# Patient Record
Sex: Male | Born: 1945 | Race: Black or African American | Hispanic: No | Marital: Married | Smoking: Former smoker
Health system: Southern US, Community
[De-identification: ages and names within clinical notes are randomized; demographics above are authoritative.]

## PROBLEM LIST (undated history)

## (undated) DIAGNOSIS — I10 Essential (primary) hypertension: Secondary | ICD-10-CM

## (undated) DIAGNOSIS — C679 Malignant neoplasm of bladder, unspecified: Secondary | ICD-10-CM

## (undated) DIAGNOSIS — K625 Hemorrhage of anus and rectum: Secondary | ICD-10-CM

## (undated) DIAGNOSIS — D696 Thrombocytopenia, unspecified: Secondary | ICD-10-CM

## (undated) HISTORY — DX: Malignant neoplasm of bladder, unspecified: C67.9

## (undated) HISTORY — DX: Thrombocytopenia, unspecified: D69.6

## (undated) HISTORY — DX: Essential (primary) hypertension: I10

## (undated) HISTORY — PX: BLADDER SURGERY: SHX569

## (undated) HISTORY — PX: CYST EXCISION: SHX5701

## (undated) HISTORY — PX: UPPER GI ENDOSCOPY: SHX6162

## (undated) HISTORY — PX: COLONOSCOPY: SHX174

---

## 2013-04-22 ENCOUNTER — Ambulatory Visit (INDEPENDENT_AMBULATORY_CARE_PROVIDER_SITE_OTHER): Payer: Self-pay | Admitting: Podiatrist

## 2013-04-22 ENCOUNTER — Encounter: Payer: Self-pay | Admitting: Podiatrist

## 2013-04-22 VITALS — BP 151/80 | HR 73 | Resp 18 | Ht 72.0 in | Wt 275.0 lb

## 2013-04-22 DIAGNOSIS — M722 Plantar fascial fibromatosis: Secondary | ICD-10-CM

## 2013-04-22 DIAGNOSIS — B351 Tinea unguium: Secondary | ICD-10-CM

## 2013-04-22 NOTE — Progress Notes (Signed)
Subjective: Patient presents today for continued care of plantar fasciitis left. Patient states the right foot has continued to improve. She relates injection was maximally beneficial on the right foot however the left continues to be uncomfortable. Patient states he's been stretching and icing as instructed and he got a new pair of new balance shoes. Objective: Patient's continues to have pain on the plantar medial aspect of the left heel at the insertion of the plantar fascia on the medial calcaneall tubercle. Assessment: Plantar fasciitis left Plan: Discussed etiology, pathology, conservative vs. Surgical therapies and at this time a plantar fascial injection was recommended.  The patient agreed and a sterile skin prep was applied.  An injection consisting of kenalog and marcaine mixture was infiltrated at the point of maximal tenderness on the left Heel.  The patient tolerated this well and was given instructions for aftercare.  Patient was also dispensed powerstep inserts and given instructions for use.  Will reappoint at prn intervals

## 2013-04-22 NOTE — Patient Instructions (Signed)
Continue to ice, and stretch your foot.  Continue to wear your good supportive shoes.  Call if symptoms persist or worsen after 3 weeks Plantar Fasciitis (Heel Spur Syndrome) with Rehab The plantar fascia is a fibrous, ligament-like, soft-tissue structure that spans the bottom of the foot. Plantar fasciitis is a condition that causes pain in the foot due to inflammation of the tissue. SYMPTOMS   Pain and tenderness on the underneath side of the foot.  Pain that worsens with standing or walking. CAUSES  Plantar fasciitis is caused by irritation and injury to the plantar fascia on the underneath side of the foot. Common mechanisms of injury include:  Direct trauma to bottom of the foot.  Damage to a small nerve that runs under the foot where the main fascia attaches to the heel bone.  Stress placed on the plantar fascia due to bone spurs. RISK INCREASES WITH:   Activities that place stress on the plantar fascia (running, jumping, pivoting, or cutting).  Poor strength and flexibility.  Improperly fitted shoes.  Tight calf muscles.  Flat feet.  Failure to warm-up properly before activity.  Obesity. PREVENTION  Warm up and stretch properly before activity.  Allow for adequate recovery between workouts.  Maintain physical fitness:  Strength, flexibility, and endurance.  Cardiovascular fitness.  Maintain a health body weight.  Avoid stress on the plantar fascia.  Wear properly fitted shoes, including arch supports for individuals who have flat feet. PROGNOSIS  If treated properly, then the symptoms of plantar fasciitis usually resolve without surgery. However, occasionally surgery is necessary. RELATED COMPLICATIONS   Recurrent symptoms that may result in a chronic condition.  Problems of the lower back that are caused by compensating for the injury, such as limping.  Pain or weakness of the foot during push-off following surgery.  Chronic inflammation, scarring,  and partial or complete fascia tear, occurring more often from repeated injections. TREATMENT  Treatment initially involves the use of ice and medication to help reduce pain and inflammation. The use of strengthening and stretching exercises may help reduce pain with activity, especially stretches of the Achilles tendon. These exercises may be performed at home or with a therapist. Your caregiver may recommend that you use heel cups of arch supports to help reduce stress on the plantar fascia. Occasionally, corticosteroid injections are given to reduce inflammation. If symptoms persist for greater than 6 months despite non-surgical (conservative), then surgery may be recommended.  MEDICATION   If pain medication is necessary, then nonsteroidal anti-inflammatory medications, such as aspirin and ibuprofen, or other minor pain relievers, such as acetaminophen, are often recommended.  Do not take pain medication within 7 days before surgery.  Prescription pain relievers may be given if deemed necessary by your caregiver. Use only as directed and only as much as you need.  Corticosteroid injections may be given by your caregiver. These injections should be reserved for the most serious cases, because they may only be given a certain number of times. HEAT AND COLD  Cold treatment (icing) relieves pain and reduces inflammation. Cold treatment should be applied for 10 to 15 minutes every 2 to 3 hours for inflammation and pain and immediately after any activity that aggravates your symptoms. Use ice packs or massage the area with a piece of ice (ice massage).  Heat treatment may be used prior to performing the stretching and strengthening activities prescribed by your caregiver, physical therapist, or athletic trainer. Use a heat pack or soak the injury in warm water.  SEEK IMMEDIATE MEDICAL CARE IF:  Treatment seems to offer no benefit, or the condition worsens.  Any medications produce adverse side  effects. EXERCISES RANGE OF MOTION (ROM) AND STRETCHING EXERCISES - Plantar Fasciitis (Heel Spur Syndrome) These exercises may help you when beginning to rehabilitate your injury. Your symptoms may resolve with or without further involvement from your physician, physical therapist or athletic trainer. While completing these exercises, remember:   Restoring tissue flexibility helps normal motion to return to the joints. This allows healthier, less painful movement and activity.  An effective stretch should be held for at least 30 seconds.  A stretch should never be painful. You should only feel a gentle lengthening or release in the stretched tissue. RANGE OF MOTION - Toe Extension, Flexion  Sit with your right / left leg crossed over your opposite knee.  Grasp your toes and gently pull them back toward the top of your foot. You should feel a stretch on the bottom of your toes and/or foot.  Hold this stretch for __________ seconds.  Now, gently pull your toes toward the bottom of your foot. You should feel a stretch on the top of your toes and or foot.  Hold this stretch for __________ seconds. Repeat __________ times. Complete this stretch __________ times per day.  RANGE OF MOTION - Ankle Dorsiflexion, Active Assisted  Remove shoes and sit on a chair that is preferably not on a carpeted surface.  Place right / left foot under knee. Extend your opposite leg for support.  Keeping your heel down, slide your right / left foot back toward the chair until you feel a stretch at your ankle or calf. If you do not feel a stretch, slide your bottom forward to the edge of the chair, while still keeping your heel down.  Hold this stretch for __________ seconds. Repeat __________ times. Complete this stretch __________ times per day.  STRETCH  Gastroc, Standing  Place hands on wall.  Extend right / left leg, keeping the front knee somewhat bent.  Slightly point your toes inward on your back  foot.  Keeping your right / left heel on the floor and your knee straight, shift your weight toward the wall, not allowing your back to arch.  You should feel a gentle stretch in the right / left calf. Hold this position for __________ seconds. Repeat __________ times. Complete this stretch __________ times per day. STRETCH  Soleus, Standing  Place hands on wall.  Extend right / left leg, keeping the other knee somewhat bent.  Slightly point your toes inward on your back foot.  Keep your right / left heel on the floor, bend your back knee, and slightly shift your weight over the back leg so that you feel a gentle stretch deep in your back calf.  Hold this position for __________ seconds. Repeat __________ times. Complete this stretch __________ times per day. STRETCH  Gastrocsoleus, Standing  Note: This exercise can place a lot of stress on your foot and ankle. Please complete this exercise only if specifically instructed by your caregiver.   Place the ball of your right / left foot on a step, keeping your other foot firmly on the same step.  Hold on to the wall or a rail for balance.  Slowly lift your other foot, allowing your body weight to press your heel down over the edge of the step.  You should feel a stretch in your right / left calf.  Hold this position for __________  seconds.  Repeat this exercise with a slight bend in your right / left knee. Repeat __________ times. Complete this stretch __________ times per day.  STRENGTHENING EXERCISES - Plantar Fasciitis (Heel Spur Syndrome)  These exercises may help you when beginning to rehabilitate your injury. They may resolve your symptoms with or without further involvement from your physician, physical therapist or athletic trainer. While completing these exercises, remember:   Muscles can gain both the endurance and the strength needed for everyday activities through controlled exercises.  Complete these exercises as  instructed by your physician, physical therapist or athletic trainer. Progress the resistance and repetitions only as guided. STRENGTH - Towel Curls  Sit in a chair positioned on a non-carpeted surface.  Place your foot on a towel, keeping your heel on the floor.  Pull the towel toward your heel by only curling your toes. Keep your heel on the floor.  If instructed by your physician, physical therapist or athletic trainer, add ____________________ at the end of the towel. Repeat __________ times. Complete this exercise __________ times per day. STRENGTH - Ankle Inversion  Secure one end of a rubber exercise band/tubing to a fixed object (table, pole). Loop the other end around your foot just before your toes.  Place your fists between your knees. This will focus your strengthening at your ankle.  Slowly, pull your big toe up and in, making sure the band/tubing is positioned to resist the entire motion.  Hold this position for __________ seconds.  Have your muscles resist the band/tubing as it slowly pulls your foot back to the starting position. Repeat __________ times. Complete this exercises __________ times per day.  Document Released: 07/08/2005 Document Revised: 09/30/2011 Document Reviewed: 10/20/2008 [facility] Patient Information 2014 [place], [place].

## 2013-04-23 MED ORDER — TRIAMCINOLONE ACETONIDE 10 MG/ML IJ SUSP
10.0000 mg | Freq: Once | INTRAMUSCULAR | Status: AC
  Administered 2013-04-22: 10 mg

## 2013-04-23 NOTE — Addendum Note (Signed)
Addended by: Delories Heinz on: 04/23/2013 08:56 AM   Modules accepted: Orders

## 2013-06-10 ENCOUNTER — Ambulatory Visit: Payer: Self-pay | Admitting: Emergency Medicine

## 2013-06-10 LAB — CBC WITH DIFFERENTIAL/PLATELET
Basophil #: 0 10*3/uL (ref 0.0–0.1)
Basophil %: 0.5 %
Eosinophil #: 0.1 10*3/uL (ref 0.0–0.7)
Eosinophil %: 1.4 %
HCT: 30.4 % — ABNORMAL LOW (ref 40.0–52.0)
HGB: 9.4 g/dL — ABNORMAL LOW (ref 13.0–18.0)
Lymphocyte #: 2.6 10*3/uL (ref 1.0–3.6)
Lymphocyte %: 29.5 %
MCH: 27 pg (ref 26.0–34.0)
MCHC: 30.8 g/dL — ABNORMAL LOW (ref 32.0–36.0)
MCV: 88 fL (ref 80–100)
Monocyte #: 0.9 x10 3/mm (ref 0.2–1.0)
Monocyte %: 9.8 %
Neutrophil #: 5.1 10*3/uL (ref 1.4–6.5)
Neutrophil %: 58.8 %
Platelet: 795 10*3/uL — ABNORMAL HIGH (ref 150–440)
RBC: 3.47 10*6/uL — ABNORMAL LOW (ref 4.40–5.90)
RDW: 15.3 % — ABNORMAL HIGH (ref 11.5–14.5)
WBC: 8.8 10*3/uL (ref 3.8–10.6)

## 2013-06-10 LAB — COMPREHENSIVE METABOLIC PANEL
Albumin: 4.1 g/dL (ref 3.4–5.0)
Alkaline Phosphatase: 83 U/L (ref 50–136)
Anion Gap: 8 (ref 7–16)
BUN: 13 mg/dL (ref 7–18)
Bilirubin,Total: 0.4 mg/dL (ref 0.2–1.0)
Calcium, Total: 10.2 mg/dL — ABNORMAL HIGH (ref 8.5–10.1)
Chloride: 104 mmol/L (ref 98–107)
Co2: 30 mmol/L (ref 21–32)
Creatinine: 1.2 mg/dL (ref 0.60–1.30)
EGFR (African American): >60
EGFR (Non-African Amer.): >60
Glucose: 88 mg/dL (ref 65–99)
Osmolality: 283 (ref 275–301)
Potassium: 4.6 mmol/L (ref 3.5–5.1)
SGOT(AST): 26 U/L (ref 15–37)
SGPT (ALT): 48 U/L (ref 12–78)
Sodium: 142 mmol/L (ref 136–145)
Total Protein: 8.1 g/dL (ref 6.4–8.2)

## 2013-06-10 LAB — OCCULT BLOOD X 1 CARD TO LAB, STOOL: Occult Blood, Feces: POSITIVE

## 2013-06-10 LAB — URINALYSIS, COMPLETE
Bacteria: NEGATIVE
Bilirubin,UR: NEGATIVE
Blood: NEGATIVE
Glucose,UR: NEGATIVE mg/dL (ref 0–75)
Leukocyte Esterase: NEGATIVE
Specific Gravity: 1.015 (ref 1.003–1.030)

## 2013-10-26 DIAGNOSIS — D509 Iron deficiency anemia, unspecified: Secondary | ICD-10-CM | POA: Diagnosis not present

## 2013-11-11 ENCOUNTER — Ambulatory Visit: Payer: Self-pay | Admitting: Gastroenterology

## 2013-11-11 DIAGNOSIS — E669 Obesity, unspecified: Secondary | ICD-10-CM | POA: Diagnosis not present

## 2013-11-11 DIAGNOSIS — D126 Benign neoplasm of colon, unspecified: Secondary | ICD-10-CM | POA: Diagnosis not present

## 2013-11-11 DIAGNOSIS — M1710 Unilateral primary osteoarthritis, unspecified knee: Secondary | ICD-10-CM | POA: Diagnosis not present

## 2013-11-11 DIAGNOSIS — K648 Other hemorrhoids: Secondary | ICD-10-CM | POA: Diagnosis not present

## 2013-11-11 DIAGNOSIS — D509 Iron deficiency anemia, unspecified: Secondary | ICD-10-CM | POA: Diagnosis not present

## 2013-11-11 DIAGNOSIS — K269 Duodenal ulcer, unspecified as acute or chronic, without hemorrhage or perforation: Secondary | ICD-10-CM | POA: Diagnosis not present

## 2013-11-11 DIAGNOSIS — K2970 Gastritis, unspecified, without bleeding: Secondary | ICD-10-CM | POA: Diagnosis not present

## 2013-11-11 DIAGNOSIS — K2990 Gastroduodenitis, unspecified, without bleeding: Secondary | ICD-10-CM | POA: Diagnosis not present

## 2013-11-11 DIAGNOSIS — Z6838 Body mass index (BMI) 38.0-38.9, adult: Secondary | ICD-10-CM | POA: Diagnosis not present

## 2013-11-11 DIAGNOSIS — Z833 Family history of diabetes mellitus: Secondary | ICD-10-CM | POA: Diagnosis not present

## 2013-11-11 DIAGNOSIS — Z86010 Personal history of colonic polyps: Secondary | ICD-10-CM | POA: Diagnosis not present

## 2013-11-11 DIAGNOSIS — Z800 Family history of malignant neoplasm of digestive organs: Secondary | ICD-10-CM | POA: Diagnosis not present

## 2013-11-11 DIAGNOSIS — Z87891 Personal history of nicotine dependence: Secondary | ICD-10-CM | POA: Diagnosis not present

## 2013-12-01 DIAGNOSIS — D649 Anemia, unspecified: Secondary | ICD-10-CM | POA: Diagnosis not present

## 2013-12-10 ENCOUNTER — Ambulatory Visit: Payer: Self-pay | Admitting: Gastroenterology

## 2013-12-10 DIAGNOSIS — D509 Iron deficiency anemia, unspecified: Secondary | ICD-10-CM | POA: Diagnosis not present

## 2013-12-10 DIAGNOSIS — K5520 Angiodysplasia of colon without hemorrhage: Secondary | ICD-10-CM | POA: Diagnosis not present

## 2013-12-10 DIAGNOSIS — K922 Gastrointestinal hemorrhage, unspecified: Secondary | ICD-10-CM | POA: Diagnosis not present

## 2013-12-10 DIAGNOSIS — R195 Other fecal abnormalities: Secondary | ICD-10-CM | POA: Diagnosis not present

## 2013-12-14 DIAGNOSIS — M5430 Sciatica, unspecified side: Secondary | ICD-10-CM | POA: Diagnosis not present

## 2015-01-12 ENCOUNTER — Encounter: Payer: Self-pay | Admitting: *Deleted

## 2015-01-12 ENCOUNTER — Emergency Department: Payer: Medicare Other

## 2015-01-12 ENCOUNTER — Emergency Department
Admission: EM | Admit: 2015-01-12 | Discharge: 2015-01-12 | Disposition: A | Discharge status: Home / Self Care | Payer: Medicare Other | Attending: Emergency Medicine | Admitting: Emergency Medicine

## 2015-01-12 DIAGNOSIS — Z79899 Other long term (current) drug therapy: Secondary | ICD-10-CM | POA: Insufficient documentation

## 2015-01-12 DIAGNOSIS — H538 Other visual disturbances: Secondary | ICD-10-CM | POA: Diagnosis not present

## 2015-01-12 DIAGNOSIS — D473 Essential (hemorrhagic) thrombocythemia: Secondary | ICD-10-CM | POA: Insufficient documentation

## 2015-01-12 DIAGNOSIS — R51 Headache: Secondary | ICD-10-CM | POA: Diagnosis not present

## 2015-01-12 DIAGNOSIS — Z87891 Personal history of nicotine dependence: Secondary | ICD-10-CM | POA: Diagnosis not present

## 2015-01-12 DIAGNOSIS — D75839 Thrombocytosis, unspecified: Secondary | ICD-10-CM

## 2015-01-12 DIAGNOSIS — H532 Diplopia: Secondary | ICD-10-CM | POA: Diagnosis present

## 2015-01-12 HISTORY — DX: Hemorrhage of anus and rectum: K62.5

## 2015-01-12 LAB — BASIC METABOLIC PANEL
Anion gap: 5 (ref 5–15)
BUN: 12 mg/dL (ref 6–20)
CALCIUM: 10 mg/dL (ref 8.9–10.3)
CO2: 30 mmol/L (ref 22–32)
CREATININE: 1.18 mg/dL (ref 0.61–1.24)
Chloride: 104 mmol/L (ref 101–111)
GFR calc non Af Amer: >60 mL/min (ref >60)
Glucose, Bld: 116 mg/dL — ABNORMAL HIGH (ref 65–99)
POTASSIUM: 4 mmol/L (ref 3.5–5.1)
SODIUM: 139 mmol/L (ref 135–145)

## 2015-01-12 LAB — CBC
HCT: 35.5 % — ABNORMAL LOW (ref 40.0–52.0)
Hemoglobin: 11.3 g/dL — ABNORMAL LOW (ref 13.0–18.0)
MCH: 30.9 pg (ref 26.0–34.0)
MCHC: 32 g/dL (ref 32.0–36.0)
MCV: 96.6 fL (ref 80.0–100.0)
PLATELETS: 1317 10*3/uL — AB (ref 150–440)
RBC: 3.67 MIL/uL — ABNORMAL LOW (ref 4.40–5.90)
RDW: 13.8 % (ref 11.5–14.5)
WBC: 8.6 10*3/uL (ref 3.8–10.6)

## 2015-01-12 LAB — DIFFERENTIAL
Basophils Absolute: 0.1 10*3/uL (ref 0–0.1)
Basophils Relative: 1 %
Eosinophils Absolute: 0.2 10*3/uL (ref 0–0.7)
Eosinophils Relative: 2 %
Lymphs Abs: 1.8 10*3/uL (ref 1.0–3.6)
Monocytes Absolute: 0.9 10*3/uL (ref 0.2–1.0)
Neutro Abs: 5.6 10*3/uL (ref 1.4–6.5)
Neutrophils Relative %: 66 %

## 2015-01-12 LAB — APTT: APTT: 42 s — AB (ref 24–36)

## 2015-01-12 LAB — GLUCOSE, CAPILLARY: GLUCOSE-CAPILLARY: 112 mg/dL — AB (ref 65–99)

## 2015-01-12 LAB — PROTIME-INR
INR: 1.04
Prothrombin Time: 13.8 seconds (ref 11.4–15.0)

## 2015-01-12 NOTE — Discharge Instructions (Signed)
Thomas Obrien, please call your hematologist in the morning for close follow up. If you have any changes in your condition please immediately return to the ED. I discussed your case with our oncologist and she recommended close follow up with your hematologist.

## 2015-01-12 NOTE — ED Provider Notes (Addendum)
[facility] Emergency Department Provider Note  ____________________________________________  Time seen: 8 PM  I have reviewed the triage vital signs and the nursing notes.   HISTORY  Chief Complaint Headache    HPI Thomas Obrien is a 69 y.o. male who presents with brief episode of tightness behind the eyes, double vision and tingling in his lips which resolved prior to arrival. This particular event occurred 2 hours prior to arrival .He notes this lasted approximately 3 minutes. He states this has happened several times over the last 2 months. He denies focal weakness. No headache, no neck pain, no fevers, no chills. When asked about elevated platelets. Patient reports he is seeing a hematologist at the [place]. He is also currently being worked up for possible bladder mass. Feels well now and is very anxious to leave.     Past Medical History  Diagnosis Date  . Rectal bleeding     There are no active problems to display for this patient.   Past Surgical History  Procedure Laterality Date  . Cyst excision      throat  . Colonoscopy    . Upper gi endoscopy      Current Outpatient Rx  Name  Route  Sig  Dispense  Refill  . Docusate Calcium (STOOL SOFTENER PO)   Oral   Take 1 capsule by mouth 2 (two) times daily.         . ferrous sulfate 325 (65 FE) MG tablet   Oral   Take 325 mg by mouth 3 (three) times daily with meals.         Marland Kitchen omeprazole (PRILOSEC) 20 MG capsule   Oral   Take 20 mg by mouth 2 (two) times daily before a meal.         . vitamin B-12 (CYANOCOBALAMIN) 1000 MCG tablet   Oral   Take 1,000 mcg by mouth daily.         . vitamin C (ASCORBIC ACID) 500 MG tablet   Oral   Take 500 mg by mouth daily.         . Multiple Vitamins-Minerals (MULTIVITAMIN WITH MINERALS) tablet   Oral   Take 1 tablet by mouth daily.           Allergies Review of patient's allergies indicates no known allergies.  No family history on  file.  Social History History  Substance Use Topics  . Smoking status: Former Smoker    Types: Cigarettes  . Smokeless tobacco: Never Used     Comment: 2 1/2 years ago  . Alcohol Use: No    Review of Systems  Constitutional: Negative for fever. Eyes: Negative for visual changes. ENT: Negative for sore throat Cardiovascular: Negative for chest pain. Respiratory: Negative for shortness of breath. Gastrointestinal: Negative for abdominal pain, vomiting and diarrhea. Genitourinary: Negative for dysuria. Musculoskeletal: Negative for back pain. Skin: Negative for rash. Neurological: Negative for headaches or focal weakness.  Psychiatric: No anxiety  10-point ROS otherwise negative.  ____________________________________________   PHYSICAL EXAM:  VITAL SIGNS: ED Triage Vitals  Enc Vitals Group     BP 01/12/15 1819 166/82 mmHg     Pulse Rate 01/12/15 1819 90     Resp --      Temp 01/12/15 1819 97.9 F (36.6 C)     Temp Source 01/12/15 1819 Oral     SpO2 01/12/15 1819 94 %     Weight 01/12/15 1819 272 lb (123.378 kg)  Height 01/12/15 1819 6' (1.829 m)     Head Cir --      Peak Flow --      Pain Score --      Pain Loc --      Pain Edu? --      Excl. in [place]? --      Constitutional: Alert and oriented. Well appearing and in no distress. Eyes: Conjunctivae are normal.  ENT   Head: Normocephalic and atraumatic.   Mouth/Throat: Mucous membranes are moist. Cardiovascular: Normal rate, regular rhythm. Normal and symmetric distal pulses are present in all extremities. No murmurs, rubs, or gallops. Respiratory: Normal respiratory effort without tachypnea nor retractions. Breath sounds are clear and equal bilaterally.  Gastrointestinal: Soft and non-tender in all quadrants. No distention. There is no CVA tenderness. Genitourinary: deferred Musculoskeletal: Nontender with normal range of motion in all extremities. No lower extremity tenderness nor edema. Neurologic:   Normal speech and language. No gross focal neurologic deficits are appreciated. Skin:  Skin is warm, dry and intact. No rash noted. Psychiatric: Mood and affect are normal. Patient exhibits appropriate insight and judgment.  ____________________________________________    LABS (pertinent positives/negatives)  Labs Reviewed  BASIC METABOLIC PANEL - Abnormal; Notable for the following:    Glucose, Bld 116 (*)    All other components within normal limits  GLUCOSE, CAPILLARY - Abnormal; Notable for the following:    Glucose-Capillary 112 (*)    All other components within normal limits  APTT - Abnormal; Notable for the following:    aPTT 42 (*)    All other components within normal limits  CBC - Abnormal; Notable for the following:    RBC 3.67 (*)    Hemoglobin 11.3 (*)    HCT 35.5 (*)    Platelets 1317 (*)    All other components within normal limits  PROTIME-INR  DIFFERENTIAL  PATHOLOGIST SMEAR REVIEW  I-STAT CHEM 8, ED  CBG MONITORING, ED    ____________________________________________   EKG  ED ECG REPORT I, Lavonia Drafts, the attending physician, personally viewed and interpreted this ECG.  Date: 01/21/2015  Rate: 81 Rhythm: normal sinus rhythm QRS Axis: normal Intervals: normal ST/T Wave abnormalities: normal Conduction Disutrbances: none Narrative Interpretation: unremarkable   ____________________________________________    RADIOLOGY  CT head no acute distress  ____________________________________________   PROCEDURES  Procedure(s) performed: none  Critical Care performed: none  ____________________________________________   INITIAL IMPRESSION / ASSESSMENT AND PLAN / ED COURSE  Pertinent labs & imaging results that were available during my care of the patient were reviewed by me and considered in my medical decision making (see chart for details).  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  I consulted with Dr. Mike Gip of oncology who recommended close outpatient follow-up with patient's hematologist tomorrow. Patient strongly prefers this as he is not willing to stay in the hospital because he feels very well. He understands that if there is any change in his condition he is to return immediately.  ____________________________________________   FINAL CLINICAL IMPRESSION(S) / ED DIAGNOSES  Final diagnoses:  Thrombocythemia     Lavonia Drafts, MD 01/12/15 7510  Lavonia Drafts, MD 01/21/15 1539

## 2015-01-12 NOTE — ED Notes (Signed)
Pt had an episode of a head strain with blurred vision and numb lips, episode lasted for 3 minutes which has resolved, pt reports previous episodes of the same thing a few weeks ago

## 2015-01-18 LAB — PATHOLOGIST SMEAR REVIEW

## 2017-01-14 IMAGING — CT CT HEAD W/O CM
1 series · 16 of 30 positions shown, 20 images · non-contrast
Comparison: None.

CLINICAL DATA: Headache, blurred vision and numb lips 1 hour ago.

EXAM:
CT HEAD WITHOUT CONTRAST
TECHNIQUE: Contiguous axial images were obtained from the base of the skull
through the vertex without intravenous contrast.

[Series 2: head wo · axial · 0.42mm/px · z∈[-139,+5]mm · 16 of 36 slices shown, 20 images]
[im 2/36  brain]
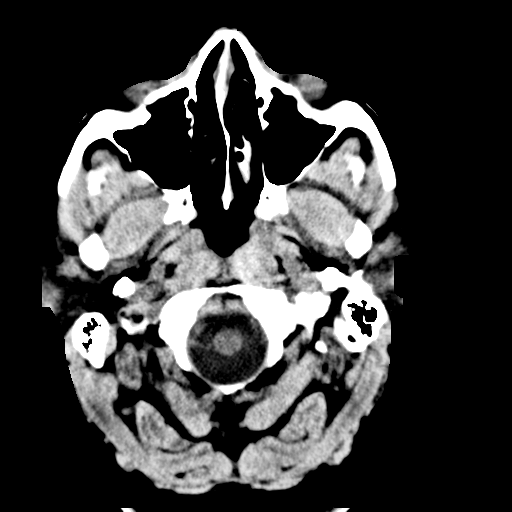
[im 2/36  bone]
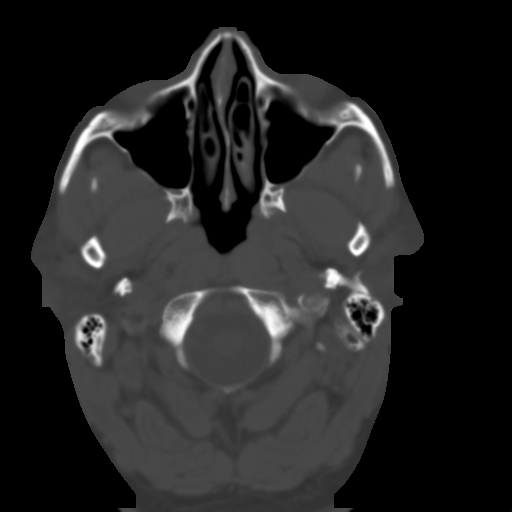
[im 4/36  brain]
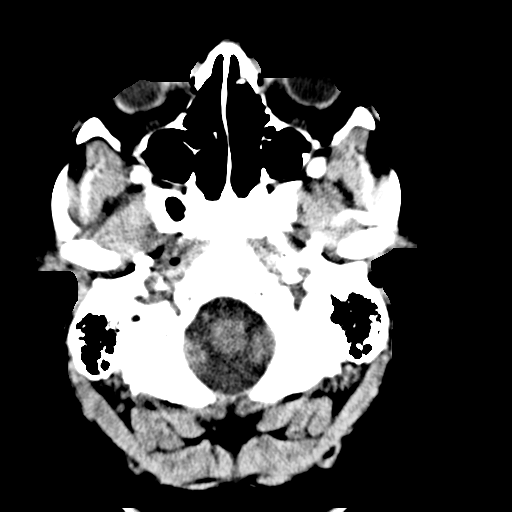
[im 7/36  brain]
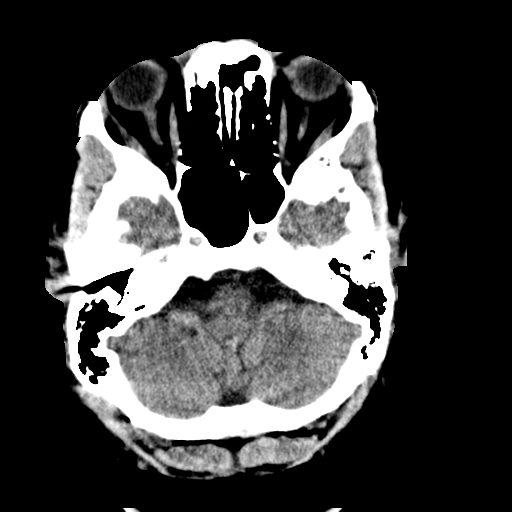
[im 9/36  brain]
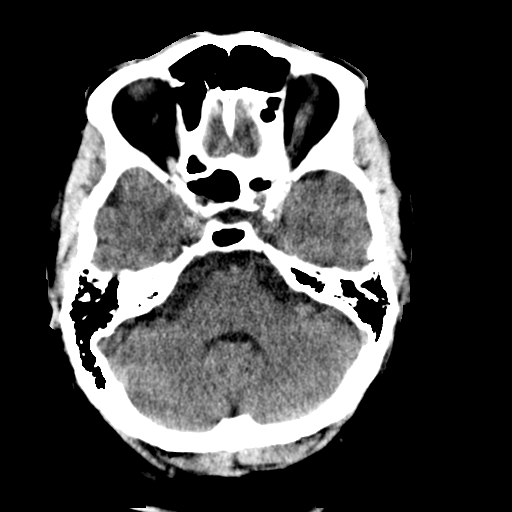
[im 10/36  brain]
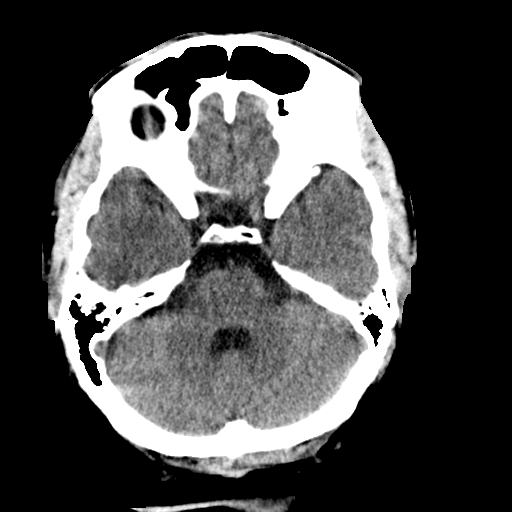
[im 10/36  bone]
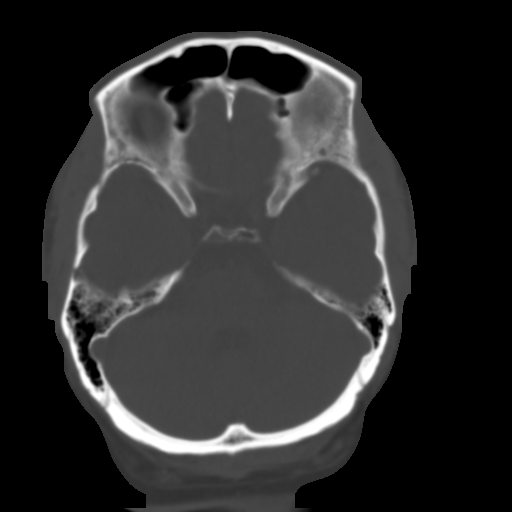
[im 13/36  brain]
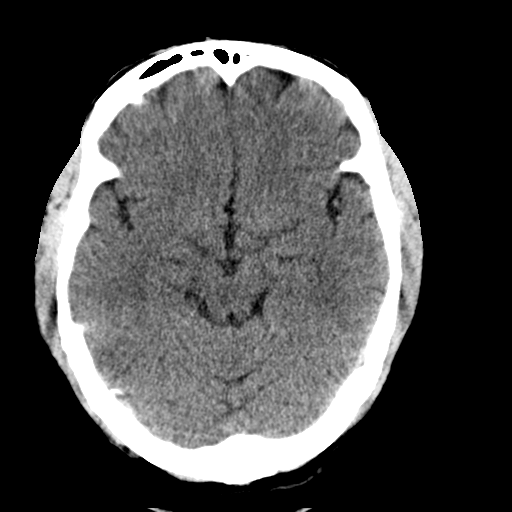
[im 15/36  brain]
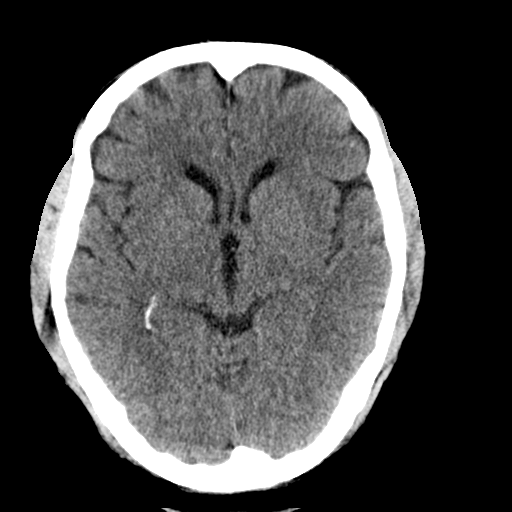
[im 17/36  brain]
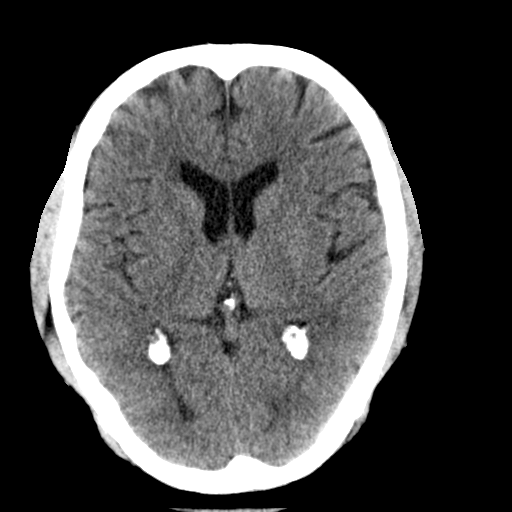
[im 19/36  brain]
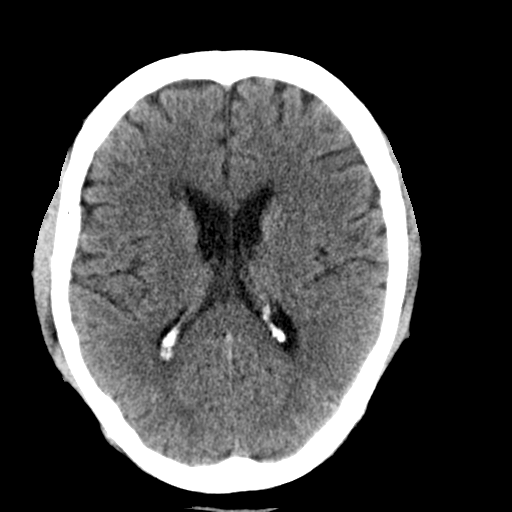
[im 19/36  bone]
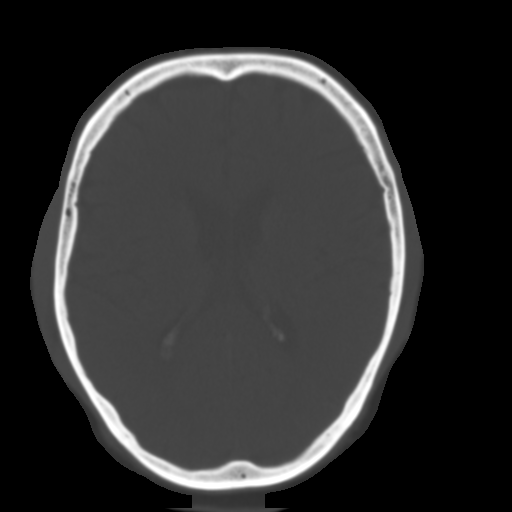
[im 21/36  brain]
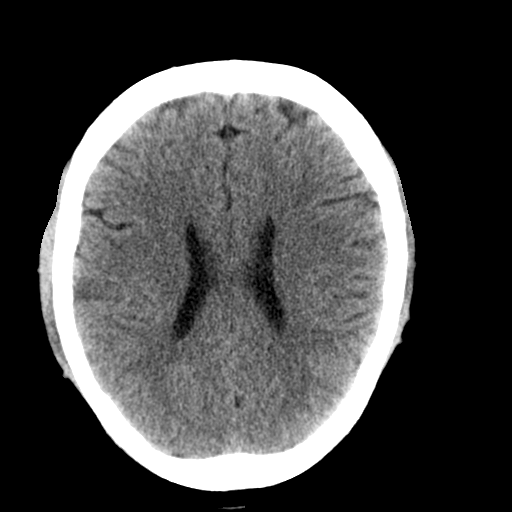
[im 23/36  brain]
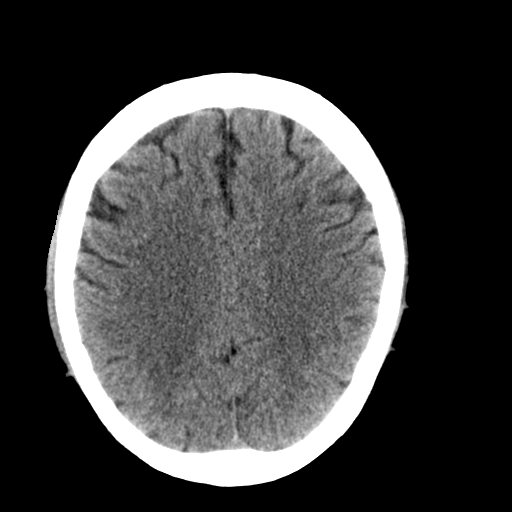
[im 26/36  brain]
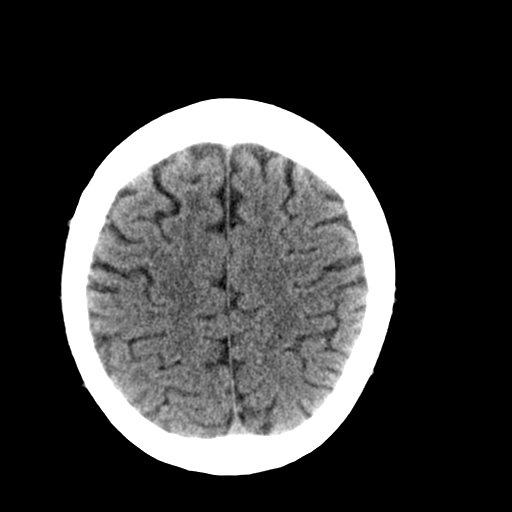
[im 27/36  brain]
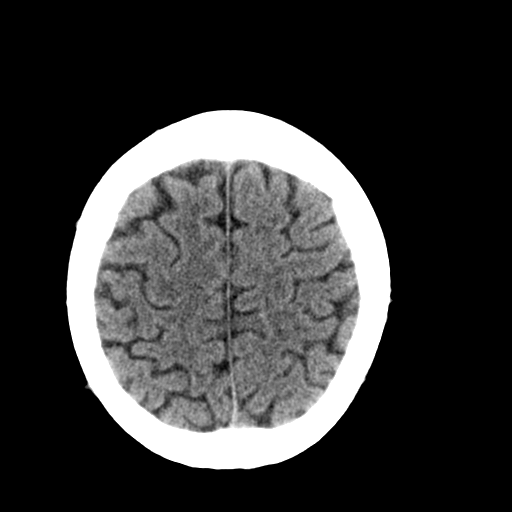
[im 27/36  bone]
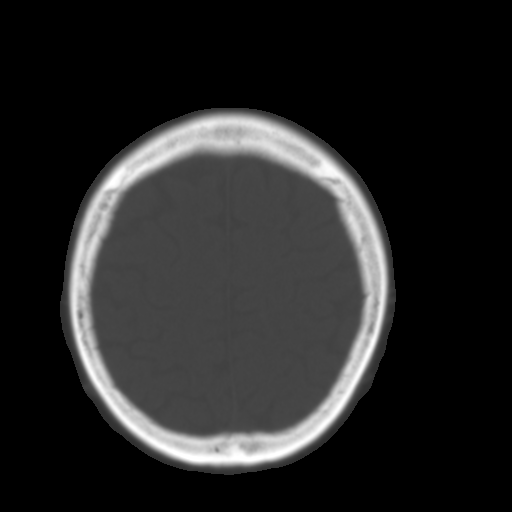
[im 29/36  brain]
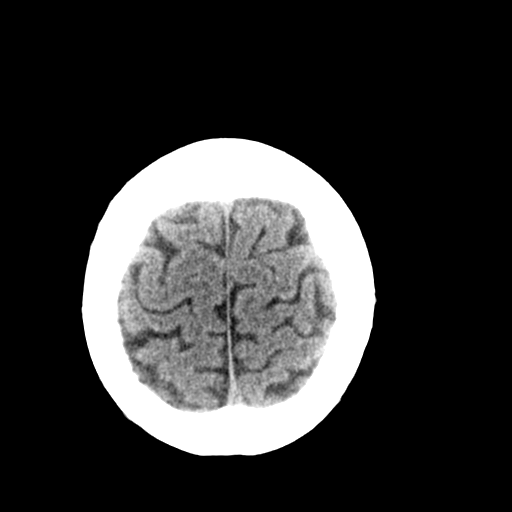
[im 32/36  brain]
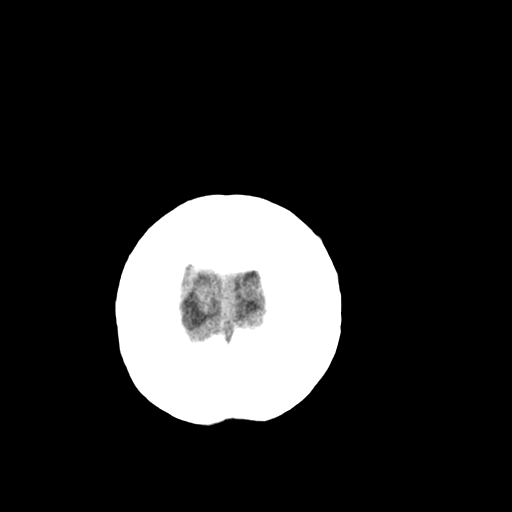
[im 34/36  brain]
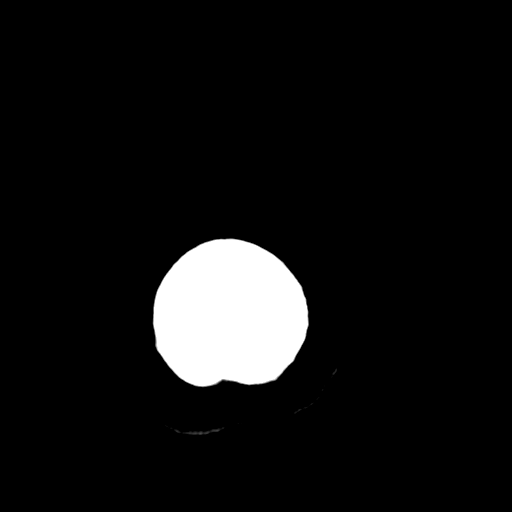

[16 of 30 positions shown; findings below may reference images not displayed]

FINDINGS: The ventricles are normal in size and configuration. No extra-axial
fluid collections are identified. The gray-white differentiation is
normal. No CT findings for acute intracranial process such as
hemorrhage or infarction. No mass lesions. The brainstem and
cerebellum are grossly normal.

The bony structures are intact. The paranasal sinuses and mastoid
air cells are clear. The globes are intact.
IMPRESSION: Normal head CT

## 2017-10-20 DIAGNOSIS — R350 Frequency of micturition: Secondary | ICD-10-CM | POA: Diagnosis not present

## 2017-10-20 DIAGNOSIS — Z905 Acquired absence of kidney: Secondary | ICD-10-CM | POA: Diagnosis not present

## 2017-10-20 DIAGNOSIS — Z7689 Persons encountering health services in other specified circumstances: Secondary | ICD-10-CM | POA: Diagnosis not present

## 2017-10-20 DIAGNOSIS — Z6835 Body mass index (BMI) 35.0-35.9, adult: Secondary | ICD-10-CM | POA: Diagnosis not present

## 2017-10-20 DIAGNOSIS — D473 Essential (hemorrhagic) thrombocythemia: Secondary | ICD-10-CM | POA: Insufficient documentation

## 2017-10-20 DIAGNOSIS — Z8551 Personal history of malignant neoplasm of bladder: Secondary | ICD-10-CM | POA: Insufficient documentation

## 2017-10-20 DIAGNOSIS — N529 Male erectile dysfunction, unspecified: Secondary | ICD-10-CM | POA: Diagnosis not present

## 2017-10-20 DIAGNOSIS — D75839 Thrombocytosis, unspecified: Secondary | ICD-10-CM | POA: Insufficient documentation

## 2017-11-04 DIAGNOSIS — R8290 Unspecified abnormal findings in urine: Secondary | ICD-10-CM | POA: Diagnosis not present

## 2017-11-04 DIAGNOSIS — R399 Unspecified symptoms and signs involving the genitourinary system: Secondary | ICD-10-CM | POA: Diagnosis not present

## 2017-12-03 DIAGNOSIS — H40013 Open angle with borderline findings, low risk, bilateral: Secondary | ICD-10-CM | POA: Diagnosis not present

## 2017-12-03 DIAGNOSIS — H52223 Regular astigmatism, bilateral: Secondary | ICD-10-CM | POA: Diagnosis not present

## 2017-12-03 DIAGNOSIS — H4720 Unspecified optic atrophy: Secondary | ICD-10-CM | POA: Diagnosis not present

## 2017-12-03 DIAGNOSIS — H524 Presbyopia: Secondary | ICD-10-CM | POA: Diagnosis not present

## 2017-12-03 DIAGNOSIS — H2513 Age-related nuclear cataract, bilateral: Secondary | ICD-10-CM | POA: Diagnosis not present

## 2017-12-03 DIAGNOSIS — H5203 Hypermetropia, bilateral: Secondary | ICD-10-CM | POA: Diagnosis not present

## 2018-01-21 ENCOUNTER — Ambulatory Visit: Payer: Self-pay | Admitting: Urology

## 2018-03-12 DIAGNOSIS — Z79899 Other long term (current) drug therapy: Secondary | ICD-10-CM | POA: Diagnosis not present

## 2018-03-12 DIAGNOSIS — I10 Essential (primary) hypertension: Secondary | ICD-10-CM | POA: Insufficient documentation

## 2018-03-12 DIAGNOSIS — R718 Other abnormality of red blood cells: Secondary | ICD-10-CM | POA: Diagnosis not present

## 2018-03-12 DIAGNOSIS — Z8551 Personal history of malignant neoplasm of bladder: Secondary | ICD-10-CM | POA: Diagnosis not present

## 2018-03-12 DIAGNOSIS — L918 Other hypertrophic disorders of the skin: Secondary | ICD-10-CM | POA: Diagnosis not present

## 2018-03-12 DIAGNOSIS — Z125 Encounter for screening for malignant neoplasm of prostate: Secondary | ICD-10-CM | POA: Diagnosis not present

## 2018-03-12 DIAGNOSIS — Z905 Acquired absence of kidney: Secondary | ICD-10-CM | POA: Diagnosis not present

## 2018-03-12 DIAGNOSIS — I2510 Atherosclerotic heart disease of native coronary artery without angina pectoris: Secondary | ICD-10-CM | POA: Insufficient documentation

## 2018-03-12 DIAGNOSIS — D473 Essential (hemorrhagic) thrombocythemia: Secondary | ICD-10-CM | POA: Diagnosis not present

## 2018-04-01 DIAGNOSIS — H40023 Open angle with borderline findings, high risk, bilateral: Secondary | ICD-10-CM | POA: Diagnosis not present

## 2018-04-01 DIAGNOSIS — H2513 Age-related nuclear cataract, bilateral: Secondary | ICD-10-CM | POA: Diagnosis not present

## 2018-04-17 ENCOUNTER — Ambulatory Visit: Payer: PPO | Admitting: Urology

## 2018-04-17 ENCOUNTER — Encounter: Payer: Self-pay | Admitting: Urology

## 2018-04-17 VITALS — BP 163/103 | HR 74 | Ht 72.0 in | Wt 259.7 lb

## 2018-04-17 DIAGNOSIS — C679 Malignant neoplasm of bladder, unspecified: Secondary | ICD-10-CM | POA: Diagnosis not present

## 2018-04-17 NOTE — Progress Notes (Signed)
04/17/2018 2:08 PM   Thomas Obrien April 09, 1946 [id number]  Referring provider: Tracie Harrier, MD [address] [facility] [place], [place] [address]  Chief Complaint  Patient presents with  . 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  His care has been at the [place] in [place].  He states he has had 7 TURBTs for noninvasive low-grade disease since 2016.  He has received induction and reinduction BCG.  He states his last cystoscopy was around March 2019 and he was noted to have recurrent tumor.  Cystectomy was recommended.  He states after thinking this over he elected not to have cystectomy but he has not followed up.  He did not bring his [place] records to today's appointment.  He denies dysuria or gross hematuria.  He denies flank, abdominal, pelvic or scrotal pain.  He apparently underwent a right nephro-ureterectomy last year.   PMH: Past Medical History:  Diagnosis Date  . Bladder cancer ([place])   . Hypertension   . Rectal bleeding   . Thrombocytopenia [facility])     Surgical History: Past Surgical History:  Procedure Laterality Date  . BLADDER SURGERY    . COLONOSCOPY    . CYST EXCISION     throat  . UPPER GI ENDOSCOPY      Home Medications:  Allergies as of 04/17/2018   No Known Allergies     Medication List        Accurate as of 04/17/18  2:08 PM. Always use your most recent med list.          ergocalciferol 50000 units capsule Commonly known as:  VITAMIN D2 Take 50,000 Units by mouth once a week.   ferrous sulfate 325 (65 FE) MG tablet Take 325 mg by mouth 3 (three) times daily with meals.   FOLIC ACID PO Take by mouth.   hydroxyurea 500 MG capsule Commonly known as:  HYDREA Take by mouth.   multivitamin with minerals tablet Take 1 tablet by mouth daily.   STOOL SOFTENER PO Take 1 capsule by mouth 2 (two) times  daily.       Allergies: No Known Allergies  Family History: Family History  Problem Relation Age of Onset  . Cancer Father     Social History:  reports that he has quit smoking. His smoking use included cigarettes. He quit after 40.00 years of use. He has never used smokeless tobacco. He reports that he does not drink alcohol or use drugs.  ROS: UROLOGY Frequent Urination?: Yes Hard to postpone urination?: No Burning/pain with urination?: No Get up at night to urinate?: Yes Leakage of urine?: No Urine stream starts and stops?: No Trouble starting stream?: No Do you have to strain to urinate?: No Blood in urine?: No Urinary tract infection?: No Sexually transmitted disease?: No Injury to kidneys or bladder?: Yes Painful intercourse?: No Weak stream?: No Erection problems?: Yes Penile pain?: No  Gastrointestinal Nausea?: No Vomiting?: No Indigestion/heartburn?: No Diarrhea?: No Constipation?: No  Constitutional Fever: No Night sweats?: No Weight loss?: No Fatigue?: Yes  Skin Skin rash/lesions?: No Itching?: No  Eyes Blurred vision?: No Double vision?: No  Ears/Nose/Throat Sore throat?: No Sinus problems?: No  Hematologic/Lymphatic Swollen glands?: No Easy bruising?: No  Cardiovascular Leg swelling?: No Chest pain?: No  Respiratory Cough?: No Shortness of breath?: No  Endocrine Excessive thirst?: No  Musculoskeletal Back pain?: No  Joint pain?: No  Neurological Headaches?: No Dizziness?: No  Psychologic Depression?: No Anxiety?: No  Physical Exam: BP (!) 163/103 (BP Location: Left Arm, Patient Position: Sitting, Cuff Size: Normal)   Pulse 74   Ht 6' (1.829 m)   Wt 259 lb 11.2 oz (117.8 kg)   BMI 35.22 kg/m   Constitutional:  Alert and oriented, No acute distress. HEENT: [place] AT, moist mucus membranes.  Trachea midline, no masses. Cardiovascular: No clubbing, cyanosis, or edema. Respiratory: Normal respiratory effort, no  increased work of breathing. GI: Abdomen is soft, nontender, nondistended, no abdominal masses GU: No CVA tenderness Lymph: No cervical or inguinal lymphadenopathy. Skin: No rashes, bruises or suspicious lesions. Neurologic: Grossly intact, no focal deficits, moving all 4 extremities. Psychiatric: Normal mood and affect.   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  His last cystoscopy was over 6 months ago and apparently had recurrent disease.  He did not want to go back to the [place].  I recommended scheduling a follow-up cystoscopy in the office.  Will request his [facility] records for review.   Abbie Sons, [place] [address], [place], [place] [address] [phone]

## 2018-07-10 DIAGNOSIS — Z0000 Encounter for general adult medical examination without abnormal findings: Secondary | ICD-10-CM | POA: Diagnosis not present

## 2018-07-10 DIAGNOSIS — Z905 Acquired absence of kidney: Secondary | ICD-10-CM | POA: Diagnosis not present

## 2018-07-10 DIAGNOSIS — Z125 Encounter for screening for malignant neoplasm of prostate: Secondary | ICD-10-CM | POA: Diagnosis not present

## 2018-07-10 DIAGNOSIS — Z8551 Personal history of malignant neoplasm of bladder: Secondary | ICD-10-CM | POA: Diagnosis not present

## 2018-07-10 DIAGNOSIS — I10 Essential (primary) hypertension: Secondary | ICD-10-CM | POA: Diagnosis not present

## 2018-07-10 DIAGNOSIS — I2510 Atherosclerotic heart disease of native coronary artery without angina pectoris: Secondary | ICD-10-CM | POA: Diagnosis not present

## 2018-07-10 DIAGNOSIS — D473 Essential (hemorrhagic) thrombocythemia: Secondary | ICD-10-CM | POA: Diagnosis not present

## 2018-10-28 DIAGNOSIS — D473 Essential (hemorrhagic) thrombocythemia: Secondary | ICD-10-CM | POA: Diagnosis not present

## 2018-10-28 DIAGNOSIS — I10 Essential (primary) hypertension: Secondary | ICD-10-CM | POA: Diagnosis not present

## 2018-10-28 DIAGNOSIS — Z905 Acquired absence of kidney: Secondary | ICD-10-CM | POA: Diagnosis not present

## 2018-10-28 DIAGNOSIS — Z8551 Personal history of malignant neoplasm of bladder: Secondary | ICD-10-CM | POA: Diagnosis not present

## 2018-10-28 DIAGNOSIS — I2510 Atherosclerotic heart disease of native coronary artery without angina pectoris: Secondary | ICD-10-CM | POA: Diagnosis not present

## 2018-12-03 DIAGNOSIS — Z8551 Personal history of malignant neoplasm of bladder: Secondary | ICD-10-CM | POA: Diagnosis not present

## 2018-12-03 DIAGNOSIS — R319 Hematuria, unspecified: Secondary | ICD-10-CM | POA: Diagnosis not present

## 2018-12-03 DIAGNOSIS — I10 Essential (primary) hypertension: Secondary | ICD-10-CM | POA: Diagnosis not present

## 2018-12-03 DIAGNOSIS — R8290 Unspecified abnormal findings in urine: Secondary | ICD-10-CM | POA: Diagnosis not present

## 2018-12-03 DIAGNOSIS — D473 Essential (hemorrhagic) thrombocythemia: Secondary | ICD-10-CM | POA: Diagnosis not present

## 2018-12-10 DIAGNOSIS — I2510 Atherosclerotic heart disease of native coronary artery without angina pectoris: Secondary | ICD-10-CM | POA: Diagnosis not present

## 2018-12-10 DIAGNOSIS — I10 Essential (primary) hypertension: Secondary | ICD-10-CM | POA: Diagnosis not present

## 2018-12-10 DIAGNOSIS — Z8551 Personal history of malignant neoplasm of bladder: Secondary | ICD-10-CM | POA: Diagnosis not present

## 2018-12-10 DIAGNOSIS — D473 Essential (hemorrhagic) thrombocythemia: Secondary | ICD-10-CM | POA: Diagnosis not present

## 2018-12-10 DIAGNOSIS — Z905 Acquired absence of kidney: Secondary | ICD-10-CM | POA: Diagnosis not present

## 2019-02-10 DIAGNOSIS — Z0000 Encounter for general adult medical examination without abnormal findings: Secondary | ICD-10-CM | POA: Diagnosis not present

## 2019-02-10 DIAGNOSIS — Z905 Acquired absence of kidney: Secondary | ICD-10-CM | POA: Diagnosis not present

## 2019-02-10 DIAGNOSIS — D649 Anemia, unspecified: Secondary | ICD-10-CM | POA: Diagnosis not present

## 2019-02-10 DIAGNOSIS — I10 Essential (primary) hypertension: Secondary | ICD-10-CM | POA: Diagnosis not present

## 2019-02-10 DIAGNOSIS — I2510 Atherosclerotic heart disease of native coronary artery without angina pectoris: Secondary | ICD-10-CM | POA: Diagnosis not present

## 2019-02-10 DIAGNOSIS — D473 Essential (hemorrhagic) thrombocythemia: Secondary | ICD-10-CM | POA: Diagnosis not present

## 2019-02-10 DIAGNOSIS — Z8551 Personal history of malignant neoplasm of bladder: Secondary | ICD-10-CM | POA: Diagnosis not present

## 2019-02-10 DIAGNOSIS — Z125 Encounter for screening for malignant neoplasm of prostate: Secondary | ICD-10-CM | POA: Diagnosis not present

## 2019-04-08 DIAGNOSIS — D631 Anemia in chronic kidney disease: Secondary | ICD-10-CM | POA: Diagnosis not present

## 2019-04-08 DIAGNOSIS — N183 Chronic kidney disease, stage 3 (moderate): Secondary | ICD-10-CM | POA: Diagnosis not present

## 2019-04-08 DIAGNOSIS — I129 Hypertensive chronic kidney disease with stage 1 through stage 4 chronic kidney disease, or unspecified chronic kidney disease: Secondary | ICD-10-CM | POA: Diagnosis not present

## 2019-04-08 DIAGNOSIS — D696 Thrombocytopenia, unspecified: Secondary | ICD-10-CM | POA: Diagnosis not present

## 2019-04-08 DIAGNOSIS — N2581 Secondary hyperparathyroidism of renal origin: Secondary | ICD-10-CM | POA: Diagnosis not present

## 2019-05-17 DIAGNOSIS — I129 Hypertensive chronic kidney disease with stage 1 through stage 4 chronic kidney disease, or unspecified chronic kidney disease: Secondary | ICD-10-CM | POA: Diagnosis not present

## 2019-05-17 DIAGNOSIS — D696 Thrombocytopenia, unspecified: Secondary | ICD-10-CM | POA: Diagnosis not present

## 2019-05-17 DIAGNOSIS — D631 Anemia in chronic kidney disease: Secondary | ICD-10-CM | POA: Diagnosis not present

## 2019-05-17 DIAGNOSIS — N2581 Secondary hyperparathyroidism of renal origin: Secondary | ICD-10-CM | POA: Diagnosis not present

## 2019-05-17 DIAGNOSIS — N1832 Chronic kidney disease, stage 3b: Secondary | ICD-10-CM | POA: Diagnosis not present

## 2019-06-11 DIAGNOSIS — R8290 Unspecified abnormal findings in urine: Secondary | ICD-10-CM | POA: Diagnosis not present

## 2019-06-22 DIAGNOSIS — Z6837 Body mass index (BMI) 37.0-37.9, adult: Secondary | ICD-10-CM | POA: Diagnosis not present

## 2019-06-22 DIAGNOSIS — I10 Essential (primary) hypertension: Secondary | ICD-10-CM | POA: Diagnosis not present

## 2019-06-22 DIAGNOSIS — Z0000 Encounter for general adult medical examination without abnormal findings: Secondary | ICD-10-CM | POA: Diagnosis not present

## 2019-06-22 DIAGNOSIS — Z87891 Personal history of nicotine dependence: Secondary | ICD-10-CM | POA: Diagnosis not present

## 2019-06-22 DIAGNOSIS — N1831 Chronic kidney disease, stage 3a: Secondary | ICD-10-CM | POA: Diagnosis not present

## 2019-06-22 DIAGNOSIS — I2510 Atherosclerotic heart disease of native coronary artery without angina pectoris: Secondary | ICD-10-CM | POA: Diagnosis not present

## 2019-06-22 DIAGNOSIS — Z905 Acquired absence of kidney: Secondary | ICD-10-CM | POA: Diagnosis not present

## 2019-06-22 DIAGNOSIS — N2581 Secondary hyperparathyroidism of renal origin: Secondary | ICD-10-CM | POA: Diagnosis not present

## 2019-06-22 DIAGNOSIS — Z125 Encounter for screening for malignant neoplasm of prostate: Secondary | ICD-10-CM | POA: Diagnosis not present

## 2019-06-22 DIAGNOSIS — D649 Anemia, unspecified: Secondary | ICD-10-CM | POA: Diagnosis not present

## 2019-06-22 DIAGNOSIS — Z8551 Personal history of malignant neoplasm of bladder: Secondary | ICD-10-CM | POA: Diagnosis not present

## 2019-06-22 DIAGNOSIS — R310 Gross hematuria: Secondary | ICD-10-CM | POA: Diagnosis not present

## 2019-06-22 DIAGNOSIS — Z79899 Other long term (current) drug therapy: Secondary | ICD-10-CM | POA: Diagnosis not present

## 2019-08-17 DIAGNOSIS — D631 Anemia in chronic kidney disease: Secondary | ICD-10-CM | POA: Diagnosis not present

## 2019-08-17 DIAGNOSIS — I129 Hypertensive chronic kidney disease with stage 1 through stage 4 chronic kidney disease, or unspecified chronic kidney disease: Secondary | ICD-10-CM | POA: Diagnosis not present

## 2019-08-17 DIAGNOSIS — R809 Proteinuria, unspecified: Secondary | ICD-10-CM | POA: Diagnosis not present

## 2019-08-17 DIAGNOSIS — N2581 Secondary hyperparathyroidism of renal origin: Secondary | ICD-10-CM | POA: Diagnosis not present

## 2019-08-17 DIAGNOSIS — N1832 Chronic kidney disease, stage 3b: Secondary | ICD-10-CM | POA: Diagnosis not present

## 2019-08-31 ENCOUNTER — Other Ambulatory Visit: Payer: Self-pay | Admitting: Nephrology

## 2019-08-31 DIAGNOSIS — R809 Proteinuria, unspecified: Secondary | ICD-10-CM

## 2019-09-06 DIAGNOSIS — N1832 Chronic kidney disease, stage 3b: Secondary | ICD-10-CM | POA: Diagnosis not present

## 2019-09-06 DIAGNOSIS — R809 Proteinuria, unspecified: Secondary | ICD-10-CM | POA: Diagnosis not present

## 2019-09-08 ENCOUNTER — Other Ambulatory Visit: Admission: RE | Admit: 2019-09-08 | Payer: Medicare Other | Source: Ambulatory Visit

## 2019-09-10 ENCOUNTER — Ambulatory Visit: Payer: Medicare Other

## 2019-11-23 DIAGNOSIS — N2581 Secondary hyperparathyroidism of renal origin: Secondary | ICD-10-CM | POA: Diagnosis not present

## 2019-11-23 DIAGNOSIS — R809 Proteinuria, unspecified: Secondary | ICD-10-CM | POA: Diagnosis not present

## 2019-11-23 DIAGNOSIS — D631 Anemia in chronic kidney disease: Secondary | ICD-10-CM | POA: Diagnosis not present

## 2019-11-23 DIAGNOSIS — I129 Hypertensive chronic kidney disease with stage 1 through stage 4 chronic kidney disease, or unspecified chronic kidney disease: Secondary | ICD-10-CM | POA: Diagnosis not present

## 2019-11-23 DIAGNOSIS — N1832 Chronic kidney disease, stage 3b: Secondary | ICD-10-CM | POA: Diagnosis not present

## 2020-10-23 DIAGNOSIS — N453 Epididymo-orchitis: Secondary | ICD-10-CM | POA: Diagnosis not present

## 2021-05-10 DIAGNOSIS — H524 Presbyopia: Secondary | ICD-10-CM | POA: Diagnosis not present

## 2021-05-10 DIAGNOSIS — H52223 Regular astigmatism, bilateral: Secondary | ICD-10-CM | POA: Diagnosis not present

## 2021-05-10 DIAGNOSIS — H5203 Hypermetropia, bilateral: Secondary | ICD-10-CM | POA: Diagnosis not present

## 2021-05-10 DIAGNOSIS — H2513 Age-related nuclear cataract, bilateral: Secondary | ICD-10-CM | POA: Diagnosis not present

## 2021-05-10 DIAGNOSIS — H40023 Open angle with borderline findings, high risk, bilateral: Secondary | ICD-10-CM | POA: Diagnosis not present

## 2021-05-22 DIAGNOSIS — H401132 Primary open-angle glaucoma, bilateral, moderate stage: Secondary | ICD-10-CM | POA: Diagnosis not present

## 2021-05-22 DIAGNOSIS — H524 Presbyopia: Secondary | ICD-10-CM | POA: Diagnosis not present

## 2021-05-22 DIAGNOSIS — H5203 Hypermetropia, bilateral: Secondary | ICD-10-CM | POA: Diagnosis not present

## 2021-05-22 DIAGNOSIS — H52223 Regular astigmatism, bilateral: Secondary | ICD-10-CM | POA: Diagnosis not present

## 2021-05-22 DIAGNOSIS — H40023 Open angle with borderline findings, high risk, bilateral: Secondary | ICD-10-CM | POA: Diagnosis not present

## 2021-05-22 DIAGNOSIS — H2513 Age-related nuclear cataract, bilateral: Secondary | ICD-10-CM | POA: Diagnosis not present
